# Patient Record
Sex: Female | Born: 2000 | Race: White | Hispanic: No | Marital: Single | Smoking: Former smoker
Health system: Southern US, Community
[De-identification: ages and names within clinical notes are randomized; demographics above are authoritative.]

## PROBLEM LIST (undated history)

## (undated) HISTORY — PX: NO PAST SURGERIES: SHX2092

---

## 2019-08-01 ENCOUNTER — Encounter: Payer: Self-pay | Admitting: Emergency Medicine

## 2019-08-01 ENCOUNTER — Ambulatory Visit (INDEPENDENT_AMBULATORY_CARE_PROVIDER_SITE_OTHER): Payer: Worker's Compensation

## 2019-08-01 ENCOUNTER — Ambulatory Visit
Admission: EM | Admit: 2019-08-01 | Discharge: 2019-08-01 | Disposition: A | Discharge status: Home / Self Care | Payer: Worker's Compensation | Attending: Family Medicine | Admitting: Family Medicine

## 2019-08-01 ENCOUNTER — Other Ambulatory Visit: Payer: Self-pay

## 2019-08-01 DIAGNOSIS — S97112A Crushing injury of left great toe, initial encounter: Secondary | ICD-10-CM

## 2019-08-01 DIAGNOSIS — M79675 Pain in left toe(s): Secondary | ICD-10-CM

## 2019-08-01 MED ORDER — KETOROLAC TROMETHAMINE 10 MG PO TABS: 10.0000 mg | ORAL_TABLET | Freq: Four times a day (QID) | ORAL | 0 refills | Status: DC | PRN

## 2019-08-01 NOTE — ED Triage Notes (Signed)
Pt c/o left great toe pain. She states a roll of bags fell on her toe. This occurred today at work.

## 2019-08-01 NOTE — ED Provider Notes (Signed)
[facility]    CSN: [id number] Arrival date & time: 08/01/19  1328      History   Chief Complaint Chief Complaint  Patient presents with  . Toe Injury    left Workers comp    HPI  19 year old female presents with the above complaint.  This is a [employer]. injury.  Patient reports that she was at work and dropped a roll of plastic bags on her left foot.  Patient states that the role is approximately 80 pounds.  She states that it injured her left great toe.  Patient reports pain of her great toe distally.  She reports her pain is 5/10 in severity.  Exacerbated by activity.  Also exacerbated by touch.  No relieving factors.  No other complaints.  Home Medications    Prior to Admission medications   Medication Sig Start Date End Date Taking? Authorizing Provider  ketorolac (TORADOL) 10 MG tablet Take 1 tablet (10 mg total) by mouth every 6 (six) hours as needed for moderate pain or severe pain. 08/01/19   Tommie Sams, DO  VIENVA 0.1-20 MG-MCG tablet  07/04/19   [provider]    Family History Family History  Problem Relation Age of Onset  . Healthy Mother     Social History Social History   Tobacco Use  . Smoking status: Never Smoker  . Smokeless tobacco: Never Used  Substance Use Topics  . Alcohol use: Not Currently  . Drug use: Not Currently     Allergies   Patient has no known allergies.   Review of Systems Review of Systems  Constitutional: Negative.   Musculoskeletal:       Left toe injury.   Physical Exam Triage Vital Signs ED Triage Vitals [08/01/19 1400]  Enc Vitals Group     BP (!) 92/59     Pulse Rate 62     Resp 18     Temp 98 F (36.7 C)     Temp Source Oral     SpO2 100 %     Weight 125 lb (56.7 kg)     Height 5\' 3"  (1.6 m)     Head Circumference      Peak Flow      Pain Score 5     Pain Loc      Pain Edu?      Excl. in GC?    Updated Vital Signs BP (!) 92/59 (BP Location: Left Arm)   Pulse 62   Temp  98 F (36.7 C) (Oral)   Resp 18   Ht 5\' 3"  (1.6 m)   Wt 56.7 kg   LMP 07/27/2019   SpO2 100%   BMI 22.14 kg/m   Visual Acuity Right Eye Distance:   Left Eye Distance:   Bilateral Distance:    Right Eye Near:   Left Eye Near:    Bilateral Near:     Physical Exam Vitals and nursing note reviewed.  Constitutional:      General: She is not in acute distress.    Appearance: Normal appearance. She is not ill-appearing.  HENT:     Head: Normocephalic and atraumatic.  Eyes:     General:        Right eye: No discharge.        Left eye: No discharge.     Conjunctiva/sclera: Conjunctivae normal.  Pulmonary:     Effort: Pulmonary effort is normal. No respiratory distress.  Musculoskeletal:     Comments:  Left foot -left great toe pain with tenderness palpation distally.  Patient most tender over the nail.  There is no appreciable subungual hematoma.  No bruising noted.  Neurological:     Mental Status: She is alert.  Psychiatric:        Mood and Affect: Mood normal.        Behavior: Behavior normal.    UC Treatments / Results  Labs (all labs ordered are listed, but only abnormal results are displayed) Labs Reviewed - No data to display  EKG   Radiology DG Toe Great Left  Result Date: 08/01/2019 CLINICAL DATA:  Crushing injury, left great toe pain EXAM: LEFT GREAT TOE COMPARISON:  None. FINDINGS: No definite fracture or dislocation of the left great toe. There is a tiny calcific fragment adjacent to the medial aspect of the great toe proximal phalanx at the interphalangeal joint, which may reflect a small avulsion fracture, although is of uncertain acuity. Diffuse soft tissue edema about the digit. IMPRESSION: No definite fracture or dislocation of the left great toe. There is a tiny calcific fragment adjacent to the medial aspect of the great toe proximal phalanx at the interphalangeal joint, which may reflect a small avulsion fracture, although is of uncertain acuity.  Electronically Signed   By: Eddie Candle M.D.   On: 08/01/2019 14:57    Procedures Procedures (including critical care time)  Medications Ordered in UC Medications - No data to display  Initial Impression / Assessment and Plan / UC Course  I have reviewed the triage vital signs and the nursing notes.  Pertinent labs & imaging results that were available during my care of the patient were reviewed by me and considered in my medical decision making (see chart for details).    19 year old female presents with a toe injury.  X-ray without evidence of fracture.  X-ray reviewed and interpreted independently by me.  Interpretation: There is a small calcific fragment noted.  It is quite round suggesting that it is old.  There is no evidence of acute definitive fracture or dislocation.  Patient placed in a postop shoe.  Toradol as needed.  Workmen's Comp. form filled out.  Supportive care.  Final Clinical Impressions(s) / UC Diagnoses   Final diagnoses:  Pain of left great toe  Crushing injury of left great toe, initial encounter     Discharge Instructions     Rest, ice, elevation.  Post op shoe to aid in ambulation.  Take care  Dr. Lacinda Axon     ED Prescriptions    Medication Sig Dispense Auth. Provider   ketorolac (TORADOL) 10 MG tablet Take 1 tablet (10 mg total) by mouth every 6 (six) hours as needed for moderate pain or severe pain. 20 tablet Coral Spikes, DO     PDMP not reviewed this encounter.   Coral Spikes, [place] 08/01/19 1518

## 2019-08-01 NOTE — Discharge Instructions (Addendum)
Rest, ice, elevation.  Post op shoe to aid in ambulation.  Take care  Dr. Adriana Simas

## 2020-08-24 NOTE — Patient Instructions (Signed)
I value your feedback and you entrusting us with your care. If you get a  patient survey, I would appreciate you taking the time to let us know about your experience today. Thank you! ? ? ?

## 2020-08-24 NOTE — Progress Notes (Signed)
Patient, No Pcp Per (Inactive)   Chief Complaint  Patient presents with  . Contraception    HPI:      Ms. Lynn Mcguire is a 20 y.o. No obstetric history on file. whose LMP was Patient's last menstrual period was 08/15/2020., presents today for NP [facility] consult. Menses are monthly, lasting 3-4 days, mod flow, no BTB, mild dysmen, improved with NSAIDs. Was on OCPs till a few months ago when Rx ran out, with menses Q1-2 months, 3-4 days but lighter flow and less dysmen. Would like to restart them. Has been sex active since LMP with Plan B use. No hx of HTN, DVTs, migraines with aura. Due for STD testing, no hx of STDs in past.  Pt vapes daily. Unsure if Gardasil done.   History reviewed. No pertinent past medical history.  Past Surgical History:  Procedure Laterality Date  . NO PAST SURGERIES      Family History  Problem Relation Age of Onset  . Healthy Mother     Social History   Socioeconomic History  . Marital status: Single    Spouse name: Not on file  . Number of children: Not on file  . Years of education: Not on file  . Highest education level: Not on file  Occupational History  . Not on file  Tobacco Use  . Smoking status: Current Every Day Smoker    Types: E-cigarettes    Start date: 04/02/2016  . Smokeless tobacco: Never Used  Vaping Use  . Vaping Use: Every day  Substance and Sexual Activity  . Alcohol use: Not Currently  . Drug use: Not Currently  . Sexual activity: Not on file  Other Topics Concern  . Not on file  Social History Narrative  . Not on file   Social Determinants of Health   Financial Resource Strain: Not on file  Food Insecurity: Not on file  Transportation Needs: Not on file  Physical Activity: Not on file  Stress: Not on file  Social Connections: Not on file  Intimate Partner Violence: Not on file    Outpatient Medications Prior to Visit  Medication Sig Dispense Refill  . ketorolac (TORADOL) 10 MG tablet Take 1 tablet (10 mg  total) by mouth every 6 (six) hours as needed for moderate pain or severe pain. (Patient not taking: Reported on 08/25/2020) 20 tablet 0  . VIENVA 0.1-20 MG-MCG tablet  (Patient not taking: Reported on 08/25/2020)     No facility-administered medications prior to visit.      ROS:  Review of Systems  Constitutional: Negative for fever.  Gastrointestinal: Negative for blood in stool, constipation, diarrhea, nausea and vomiting.  Genitourinary: Negative for dyspareunia, dysuria, flank pain, frequency, hematuria, urgency, vaginal bleeding, vaginal discharge and vaginal pain.  Musculoskeletal: Negative for back pain.  Skin: Negative for rash.   BREAST: No symptoms   OBJECTIVE:   Vitals:  BP 106/70   Pulse 95   Temp 99 F (37.2 C)   Resp 16   Ht 5\' 3"  (1.6 m)   Wt 121 lb (54.9 kg)   LMP 08/15/2020   SpO2 99%   BMI 21.43 kg/m   Physical Exam Vitals reviewed.  Constitutional:      Appearance: She is well-developed.  Pulmonary:     Effort: Pulmonary effort is normal.  Genitourinary:    General: Normal vulva.     Pubic Area: No rash.      Labia:        Right:  No rash, tenderness or lesion.        Left: No rash, tenderness or lesion.      Vagina: Normal. No vaginal discharge, erythema or tenderness.     Cervix: Normal.     Uterus: Normal. Not enlarged and not tender.      Adnexa: Right adnexa normal and left adnexa normal.       Right: No mass or tenderness.         Left: No mass or tenderness.    Musculoskeletal:        General: Normal range of motion.     Cervical back: Normal range of motion.  Skin:    General: Skin is warm and dry.  Neurological:     General: No focal deficit present.     Mental Status: She is alert and oriented to person, place, and time.  Psychiatric:        Mood and Affect: Mood normal.        Behavior: Behavior normal.        Thought Content: Thought content normal.        Judgment: Judgment normal.     Assessment/Plan: Encounter for  initial prescription of contraceptive pills - Plan: VIENVA 0.1-20 MG-MCG tablet; OCP start with next menses. Condoms for 1 mo. Check UPT if no menses when due since took Plan B this cycle. Rx eRxd. F/u prn.   Screening for STD (sexually transmitted disease) - Plan: Cervicovaginal ancillary only, HIV Antibody (routine testing w rflx), RPR, HSV 2 antibody, IgG, Hepatitis C antibody    Meds ordered this encounter  Medications  . VIENVA 0.1-20 MG-MCG tablet    Sig: Take 1 tablet by mouth daily.    Dispense:  84 tablet    Refill:  3    Order Specific Question:   Supervising Provider    Answer:   Nadara Mustard [[id number]]      Return in about 1 year (around 08/25/2021).  Maritza Goldsborough B. Laurelin Elson, PA-C 08/25/2020 9:46 AM

## 2020-08-25 ENCOUNTER — Ambulatory Visit (INDEPENDENT_AMBULATORY_CARE_PROVIDER_SITE_OTHER): Payer: BLUE CROSS/BLUE SHIELD | Admitting: Obstetrics and Gynecology

## 2020-08-25 ENCOUNTER — Other Ambulatory Visit: Payer: Self-pay

## 2020-08-25 ENCOUNTER — Encounter: Payer: Self-pay | Admitting: Obstetrics and Gynecology

## 2020-08-25 ENCOUNTER — Other Ambulatory Visit (HOSPITAL_COMMUNITY)
Admission: RE | Admit: 2020-08-25 | Discharge: 2020-08-25 | Disposition: A | Discharge status: Home / Self Care | Payer: Self-pay | Source: Ambulatory Visit | Attending: Obstetrics and Gynecology | Admitting: Obstetrics and Gynecology

## 2020-08-25 VITALS — BP 106/70 | HR 95 | Temp 99.0°F | Resp 16 | Ht 63.0 in | Wt 121.0 lb

## 2020-08-25 DIAGNOSIS — Z202 Contact with and (suspected) exposure to infections with a predominantly sexual mode of transmission: Secondary | ICD-10-CM

## 2020-08-25 DIAGNOSIS — Z30011 Encounter for initial prescription of contraceptive pills: Secondary | ICD-10-CM | POA: Diagnosis not present

## 2020-08-25 DIAGNOSIS — Z113 Encounter for screening for infections with a predominantly sexual mode of transmission: Secondary | ICD-10-CM | POA: Insufficient documentation

## 2020-08-25 MED ORDER — VIENVA 0.1-20 MG-MCG PO TABS: 1.0000 | ORAL_TABLET | Freq: Every day | ORAL | 3 refills | Status: DC

## 2020-08-26 LAB — CERVICOVAGINAL ANCILLARY ONLY
Chlamydia: NEGATIVE
Comment: NEGATIVE
Comment: NORMAL
Neisseria Gonorrhea: NEGATIVE

## 2020-08-26 LAB — HEPATITIS C ANTIBODY: Hep C Virus Ab: <0.1 s/co ratio (ref 0.0–0.9)

## 2020-08-26 LAB — RPR: RPR Ser Ql: NONREACTIVE

## 2020-08-26 LAB — HIV ANTIBODY (ROUTINE TESTING W REFLEX): HIV Screen 4th Generation wRfx: NONREACTIVE

## 2020-08-26 LAB — HSV 2 ANTIBODY, IGG: HSV 2 IgG, Type Spec: <0.91 index (ref 0.00–0.90)

## 2021-04-02 DIAGNOSIS — A749 Chlamydial infection, unspecified: Secondary | ICD-10-CM

## 2021-04-02 HISTORY — DX: Chlamydial infection, unspecified: A74.9

## 2021-09-01 IMAGING — CR DG TOE GREAT 2+V*L*
3 series · 3 of 3 positions shown · non-contrast
Comparison: None.

CLINICAL DATA: Crushing injury, left great toe pain

EXAM:
LEFT GREAT TOE

[toe ap]
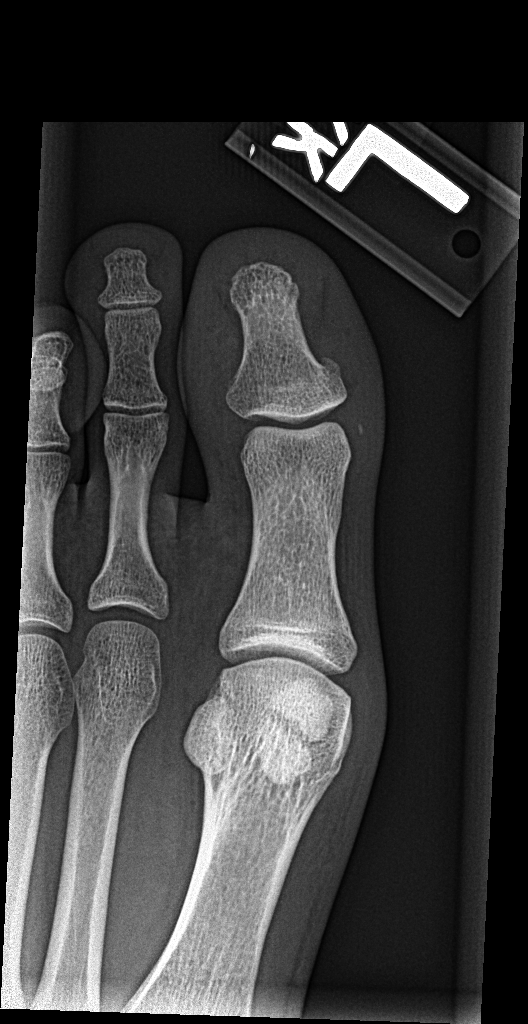

[toe obl]
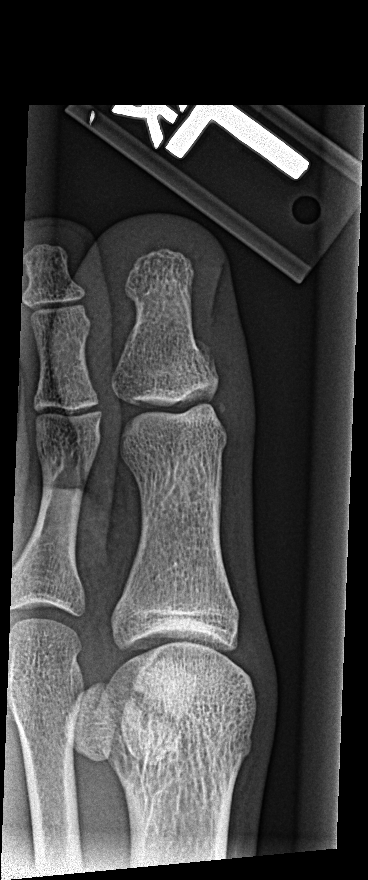

[toe lat]
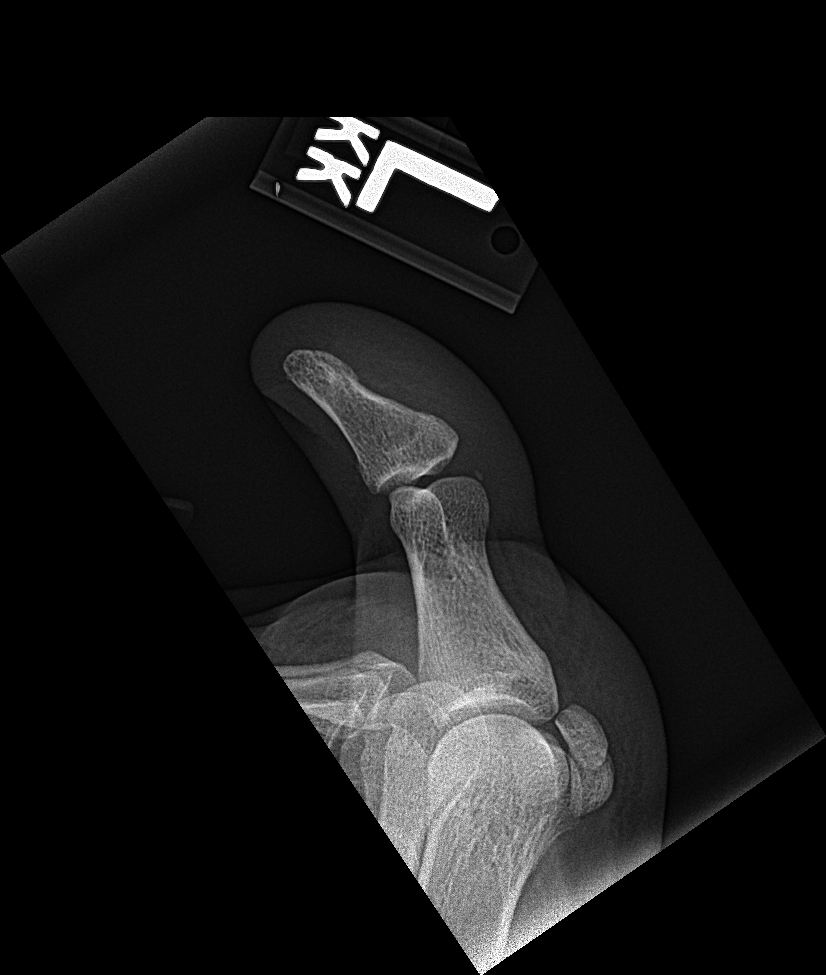

[3 of 3 positions shown; findings below may reference images not displayed]

FINDINGS: No definite fracture or dislocation of the left great toe. There is
a tiny calcific fragment adjacent to the medial aspect of the great
toe proximal phalanx at the interphalangeal joint, which may reflect
a small avulsion fracture, although is of uncertain acuity. Diffuse
soft tissue edema about the digit.
IMPRESSION: No definite fracture or dislocation of the left great toe. There is
a tiny calcific fragment adjacent to the medial aspect of the great
toe proximal phalanx at the interphalangeal joint, which may reflect
a small avulsion fracture, although is of uncertain acuity.

## 2021-10-17 ENCOUNTER — Other Ambulatory Visit (HOSPITAL_COMMUNITY)
Admission: RE | Admit: 2021-10-17 | Discharge: 2021-10-17 | Disposition: A | Discharge status: Home / Self Care | Payer: BLUE CROSS/BLUE SHIELD | Source: Ambulatory Visit | Attending: Family Medicine | Admitting: Family Medicine

## 2021-10-17 ENCOUNTER — Ambulatory Visit (INDEPENDENT_AMBULATORY_CARE_PROVIDER_SITE_OTHER): Payer: BLUE CROSS/BLUE SHIELD | Admitting: Family Medicine

## 2021-10-17 ENCOUNTER — Encounter: Payer: Self-pay | Admitting: Family Medicine

## 2021-10-17 VITALS — BP 96/58 | HR 88 | Ht 63.0 in | Wt 143.0 lb

## 2021-10-17 DIAGNOSIS — Z01419 Encounter for gynecological examination (general) (routine) without abnormal findings: Secondary | ICD-10-CM | POA: Insufficient documentation

## 2021-10-17 DIAGNOSIS — B3731 Acute candidiasis of vulva and vagina: Secondary | ICD-10-CM | POA: Insufficient documentation

## 2021-10-17 DIAGNOSIS — Z0000 Encounter for general adult medical examination without abnormal findings: Secondary | ICD-10-CM | POA: Diagnosis not present

## 2021-10-17 DIAGNOSIS — Z113 Encounter for screening for infections with a predominantly sexual mode of transmission: Secondary | ICD-10-CM | POA: Diagnosis not present

## 2021-10-17 DIAGNOSIS — A5602 Chlamydial vulvovaginitis: Secondary | ICD-10-CM | POA: Diagnosis not present

## 2021-10-17 DIAGNOSIS — Z30011 Encounter for initial prescription of contraceptive pills: Secondary | ICD-10-CM | POA: Diagnosis not present

## 2021-10-17 MED ORDER — VIENVA 0.1-20 MG-MCG PO TABS: 1.0000 | ORAL_TABLET | Freq: Every day | ORAL | 4 refills | Status: DC

## 2021-10-17 NOTE — Progress Notes (Signed)
GYNECOLOGY ANNUAL PREVENTATIVE CARE ENCOUNTER NOTE  Subjective:   Lynn Mcguire is a 21 y.o.  female here for a routine annual gynecologic exam.  Current complaints: none, needs OCP refill. Recently seen at [facility] for UTI sx. Has about 2 more days of abx left to take.  Reports no vaginal itching currently.  She is unsure about HPV vaccination. She will ask her mother.   Denies abnormal vaginal bleeding, discharge, pelvic pain, problems with intercourse or other gynecologic concerns.    Gynecologic History Patient's last menstrual period was 10/09/2021. Contraception: OCP (estrogen/progesterone) Last Pap: never had.  Last mammogram: NA.   Health Maintenance Due  Topic Date Due   HPV VACCINES (1 - 2-dose series) Never done   PAP-Cervical Cytology Screening  Never done   PAP SMEAR-Modifier  Never done   CHLAMYDIA SCREENING  08/25/2021    The following portions of the patient's history were reviewed and updated as appropriate: allergies, current medications, past family history, past medical history, past social history, past surgical history and problem list.  Review of Systems Pertinent items are noted in HPI.   Objective:  BP (!) 96/58 (Patient Position: Sitting, Cuff Size: Normal)   Pulse 88   Ht 5\' 3"  (1.6 m)   Wt 143 lb (64.9 kg)   LMP 10/09/2021   BMI 25.33 kg/m  CONSTITUTIONAL: Well-developed, well-nourished female in no acute distress.  HENT:  Normocephalic, atraumatic, External right and left ear normal. Oropharynx is clear and moist EYES:  No scleral icterus.  NECK: Normal range of motion, supple, no masses.  Normal thyroid.  SKIN: Skin is warm and dry. No rash noted. Not diaphoretic. No erythema. No pallor. NEUROLOGIC: Alert and oriented to person, place, and time. Normal reflexes, muscle tone coordination. No cranial nerve deficit noted. PSYCHIATRIC: Normal mood and affect. Normal behavior. Normal judgment and thought content. CARDIOVASCULAR: Normal heart rate noted,  regular rhythm. 2+ distal pulses. RESPIRATORY: Effort and breath sounds normal, no problems with respiration noted. BREASTS: Symmetric in size. No masses, skin changes, nipple drainage, or lymphadenopathy. ABDOMEN: Soft,  no distention noted.  No tenderness, rebound or guarding.  PELVIC: Normal appearing external genitalia-- white/thick discharge at introitus; normal appearing vaginal mucosa and cervix.  No abnormal discharge noted.  Pap smear obtained.  Normal uterine size, no other palpable masses, no uterine or adnexal tenderness. MUSCULOSKELETAL: Normal range of motion.      Assessment and Plan:  1) Annual gynecologic examination with pap smear:  Will follow up results of pap smear and manage accordingly. STI screen also ordered today- vaginal testing only.  Routine preventative health maintenance measures emphasized. Reviewed breast awareness.   2) Contraception counseling: Reviewed all forms of birth control options available including abstinence; over the counter/barrier methods; hormonal contraceptive medication including pill, patch, ring, injection,contraceptive implant; hormonal and nonhormonal IUDs; permanent sterilization options including vasectomy and the various tubal sterilization modalities. Risks and benefits reviewed.  Questions were answered.  Written information was also given to the patient to review.  Patient desires OCP, this was prescribed for patient. She will follow up in  1 year for surveillance.  She was told to call with any further questions, or with any concerns about this method of contraception.  Emphasized use of condoms 100% of the time for STI prevention.  1. Well woman exam with routine gynecological exam - Cytology - PAP  2. Screening examination for venereal disease - Cervicovaginal ancillary only  3. Encounter for initial prescription of contraceptive pills - VIENVA  0.1-20 MG-MCG tablet; Take 1 tablet by mouth daily.  Dispense: 84 tablet; Refill:  4  Please refer to After Visit Summary for other counseling recommendations.   Return in about 1 year (around 10/18/2022) for Yearly wellness exam.  Federico Flake, MD, MPH, ABFM Attending Physician Center for [facility]

## 2021-10-19 ENCOUNTER — Encounter: Payer: Self-pay | Admitting: Family Medicine

## 2021-10-19 LAB — CERVICOVAGINAL ANCILLARY ONLY
Bacterial Vaginitis (gardnerella): NEGATIVE
Candida Glabrata: NEGATIVE
Candida Vaginitis: POSITIVE — AB
Chlamydia: POSITIVE — AB
Comment: NEGATIVE
Comment: NEGATIVE
Comment: NEGATIVE
Comment: NEGATIVE
Comment: NEGATIVE
Comment: NORMAL
Neisseria Gonorrhea: NEGATIVE
Trichomonas: NEGATIVE

## 2021-10-19 LAB — CYTOLOGY - PAP: Diagnosis: NEGATIVE

## 2021-10-20 ENCOUNTER — Other Ambulatory Visit: Payer: Self-pay

## 2021-10-20 DIAGNOSIS — A749 Chlamydial infection, unspecified: Secondary | ICD-10-CM

## 2021-10-20 DIAGNOSIS — B379 Candidiasis, unspecified: Secondary | ICD-10-CM

## 2021-10-20 MED ORDER — AZITHROMYCIN 500 MG PO TABS: 1000.0000 mg | ORAL_TABLET | Freq: Once | ORAL | 0 refills | Status: DC

## 2021-10-20 MED ORDER — FLUCONAZOLE 150 MG PO TABS: 150.0000 mg | ORAL_TABLET | Freq: Once | ORAL | 0 refills | Status: AC

## 2021-10-20 MED ORDER — DOXYCYCLINE HYCLATE 100 MG PO CAPS: 100.0000 mg | ORAL_CAPSULE | Freq: Two times a day (BID) | ORAL | 0 refills | Status: DC

## 2022-11-18 NOTE — Progress Notes (Unsigned)
   PCP:  Patient, No Pcp Per   No chief complaint on file.    HPI:      Ms. Lynn Mcguire is a 22 y.o. No obstetric history on file. whose LMP was No LMP recorded., presents today for her annual examination.  Her menses are {norm/abn:715}, lasting {number: 22536} days.  Dysmenorrhea {dysmen:716}. She {does:18564} have intermenstrual bleeding.  Sex activity: {sex active: 315163}.  Last Pap: 10/17/21  Results were: no abnormalities  Hx of STDs: chlamydia; never did TOC????  There is no FH of breast cancer. There is no FH of ovarian cancer. The patient {does:18564} do self-breast exams.  Tobacco use: {tob:20664} Alcohol use: {Alcohol:11675} No drug use.  Exercise: {exercise:31265}  She {does:18564} get adequate calcium and Vitamin D in her diet.  There are no problems to display for this patient.   Past Surgical History:  Procedure Laterality Date   NO PAST SURGERIES      Family History  Problem Relation Age of Onset   Healthy Mother     Social History   Socioeconomic History   Marital status: Single    Spouse name: Not on file   Number of children: Not on file   Years of education: Not on file   Highest education level: Not on file  Occupational History   Not on file  Tobacco Use   Smoking status: Former    Types: E-cigarettes    Start date: 04/02/2016    Quit date: 08/30/2021    Years since quitting: 1.2   Smokeless tobacco: Never  Vaping Use   Vaping status: Every Day  Substance and Sexual Activity   Alcohol use: Yes    Comment: occasional   Drug use: Not Currently   Sexual activity: Not Currently  Other Topics Concern   Not on file  Social History Narrative   Not on file   Social Determinants of Health   Financial Resource Strain: Not on file  Food Insecurity: Not on file  Transportation Needs: Not on file  Physical Activity: Not on file  Stress: Not on file  Social Connections: Not on file  Intimate Partner Violence: Not on file     Current  Outpatient Medications:    doxycycline (VIBRAMYCIN) 100 MG capsule, Take 1 capsule (100 mg total) by mouth 2 (two) times daily., Disp: 14 capsule, Rfl: 0   VIENVA 0.1-20 MG-MCG tablet, Take 1 tablet by mouth daily., Disp: 84 tablet, Rfl: 4     ROS:  Review of Systems BREAST: No symptoms   Objective: There were no vitals taken for this visit.   OBGyn Exam  Results: No results found for this or any previous visit (from the past 24 hour(s)).  Assessment/Plan: No diagnosis found.  No orders of the defined types were placed in this encounter.            GYN counsel {counseling: 16159}     F/U  No follow-ups on file.  Dorrine Montone B. Doristine Shehan, PA-C 11/18/2022 3:50 PM

## 2022-11-19 ENCOUNTER — Other Ambulatory Visit (HOSPITAL_COMMUNITY)
Admission: RE | Admit: 2022-11-19 | Discharge: 2022-11-19 | Disposition: A | Discharge status: Home / Self Care | Payer: Managed Care, Other (non HMO) | Source: Ambulatory Visit | Attending: Obstetrics and Gynecology | Admitting: Obstetrics and Gynecology

## 2022-11-19 ENCOUNTER — Encounter: Payer: Self-pay | Admitting: Obstetrics and Gynecology

## 2022-11-19 ENCOUNTER — Ambulatory Visit (INDEPENDENT_AMBULATORY_CARE_PROVIDER_SITE_OTHER): Payer: Managed Care, Other (non HMO) | Admitting: Obstetrics and Gynecology

## 2022-11-19 VITALS — BP 112/69 | HR 72 | Resp 16 | Ht 64.0 in | Wt 145.1 lb

## 2022-11-19 DIAGNOSIS — A749 Chlamydial infection, unspecified: Secondary | ICD-10-CM

## 2022-11-19 DIAGNOSIS — Z113 Encounter for screening for infections with a predominantly sexual mode of transmission: Secondary | ICD-10-CM | POA: Diagnosis present

## 2022-11-19 DIAGNOSIS — Z01419 Encounter for gynecological examination (general) (routine) without abnormal findings: Secondary | ICD-10-CM | POA: Diagnosis not present

## 2022-11-19 DIAGNOSIS — Z30011 Encounter for initial prescription of contraceptive pills: Secondary | ICD-10-CM

## 2022-11-19 DIAGNOSIS — Z3041 Encounter for surveillance of contraceptive pills: Secondary | ICD-10-CM

## 2022-11-19 MED ORDER — LEVONORGESTREL-ETHINYL ESTRAD 0.1-20 MG-MCG PO TABS: 1.0000 | ORAL_TABLET | Freq: Every day | ORAL | 3 refills | Status: DC

## 2022-11-19 NOTE — Patient Instructions (Signed)
I value your feedback and you entrusting us with your care. If you get a [facility] patient survey, I would appreciate you taking the time to let us know about your experience today. Thank you! ? ? ?

## 2022-11-20 LAB — CERVICOVAGINAL ANCILLARY ONLY
Chlamydia: POSITIVE — AB
Comment: NEGATIVE
Comment: NORMAL
Neisseria Gonorrhea: NEGATIVE

## 2022-11-20 MED ORDER — AZITHROMYCIN 500 MG PO TABS: 1000.0000 mg | ORAL_TABLET | Freq: Once | ORAL | 0 refills | Status: AC

## 2022-11-20 NOTE — Progress Notes (Signed)
Psl notify [facility]. Thx.

## 2022-11-20 NOTE — Addendum Note (Signed)
Addended by: Althea Grimmer B on: 11/20/2022 01:49 PM   Modules accepted: Orders

## 2023-01-29 ENCOUNTER — Ambulatory Visit (INDEPENDENT_AMBULATORY_CARE_PROVIDER_SITE_OTHER): Payer: Managed Care, Other (non HMO)

## 2023-01-29 ENCOUNTER — Other Ambulatory Visit (HOSPITAL_COMMUNITY)
Admission: RE | Admit: 2023-01-29 | Discharge: 2023-01-29 | Disposition: A | Discharge status: Home / Self Care | Payer: Managed Care, Other (non HMO) | Source: Ambulatory Visit | Attending: Obstetrics and Gynecology | Admitting: Obstetrics and Gynecology

## 2023-01-29 VITALS — BP 103/65 | HR 73 | Ht 63.0 in | Wt 147.0 lb

## 2023-01-29 DIAGNOSIS — Z113 Encounter for screening for infections with a predominantly sexual mode of transmission: Secondary | ICD-10-CM

## 2023-01-29 NOTE — Patient Instructions (Signed)
Trichomoniasis Trichomoniasis is a sexually transmitted infection (STI). Many people with trichomoniasis do not have any symptoms (are asymptomatic) or have only minimal symptoms. Untreated trichomoniasis can last from months to years. This condition is treated with medicine. What are the causes? This condition is caused by a parasite called Trichomonas vaginalis and is transmitted during sexual contact. What increases the risk? The following factors may make you more likely to develop this condition: Having unprotected sex. Having sex with a partner who has trichomoniasis. Having multiple sexual partners. Having had previous trichomoniasis infections or other STIs. What are the signs or symptoms? In females, symptoms of trichomoniasis include: Itching, burning, redness, or soreness in the genital area. Discomfort while urinating. Abnormal vaginal discharge that is clear, white, gray, or yellow-green and foamy and has an unusual fishy odor. In males, symptoms of trichomoniasis include: Discharge from the penis. Burning after urination or ejaculation. Itching or discomfort inside the penis. How is this diagnosed? This condition is diagnosed based on tests. To perform a test, your health care provider will do one of the following: Ask you to provide a urine sample. Take a sample of discharge. The sample may be taken from the vagina or cervix in females and from the urethra in males. Your health care provider may use a swab to collect the sample. Your health care provider may test you for other STIs, including human immunodeficiency virus (HIV). How is this treated?  This condition is treated with medicines such as metronidazole or tinidazole. These are called antimicrobial medicines, and they are taken by mouth (orally). Your sexual partner or partners also need to be tested and treated. If they have the infection and are not treated, you will likely get reinfected. If you plan to become  pregnant or think you may be pregnant, tell your health care provider right away. Some medicines that are used to treat the infection should not be taken during pregnancy. Your health care provider may recommend over-the-counter medicines or creams to help relieve itching or irritation. You may be tested for the infection again 3 months after treatment. Follow these instructions at home: Medicines Take over-the-counter and prescription medicines only as told by your health care provider. Take your antimicrobial medicine as told by your health care provider. Do not stop taking it even if you start to feel better. Use creams as told by your health care provider. General instructions Do not have sex until after you finish your medicine and your symptoms have resolved. Do not wear tampons while you have the infection (if you are female). Talk with your sexual partner or partners about any symptoms that either of you may have, as well as any history of STIs. Keep all follow-up visits. This is important. How is this prevented?  Use condoms every time you have sex. Using condoms correctly and consistently can help protect against STIs. Do not have sexual contact if you have symptoms of trichomoniasis or another STI. Avoid having multiple sexual partners. Get tested for STIs before you have sex with a partner. Ask all partners to do the same. Do not douche (if you are female). Douching may increase your risk for getting STIs due to the removal of good bacteria in the vagina. Contact a health care provider if: You still have symptoms after you finish your medicine. You develop a rash. You plan to become pregnant or think you may be pregnant. Summary Trichomoniasis is a sexually transmitted infection (STI). This condition often has no symptoms or minimal  symptoms. Take your antimicrobial medicine as told by your health care provider. Do not stop taking even if you start to feel better. Discuss your  infection with your sexual partner or partners. Make sure that all partners get tested and treated, if necessary. You should not have sex until after you finish your medicine and your symptoms have resolved. Keep all follow-up visits. This is important. This information is not intended to replace advice given to you by your health care provider. Make sure you discuss any questions you have with your health care provider. Document Revised: 02/15/2021 Document Reviewed: 02/15/2021 Elsevier Patient Education  2024 [employer] Gonorrhea Gonorrhea is a sexually transmitted infection (STI) that can infect any person. If left untreated, this infection can: Damage the reproductive organs. Spread to other parts of the body. Cause someone to be unable to have children (infertility). Harm an unborn baby if an infected person is pregnant. It is important to get treatment for gonorrhea as soon as possible. All of your sex partners may also need to be treated for the infection. What are the causes? This condition is caused by bacteria called Neisseria gonorrhoeae. The infection is spread from person to person through sexual contact, including oral, anal, and vaginal sex. The infection can also pass from a pregnant person to the baby during birth. What increases the risk? The following factors may make you more likely to develop this condition: Being a woman younger than 25 years and sexually active. Being a man who has sex with men. Having a new sex partner or having multiple partners. Having a sex partner who has an STI. Not using condoms correctly or not using condoms every time you have sex. Having a history of STIs. What are the signs or symptoms? When symptoms occur, they may include: Abnormal discharge from the penis or vagina. The discharge may be cloudy, thick, or yellow-green in color. Pain or burning when you urinate. Itching, irritation, pain, bleeding, or discharge from the rectum. This  may occur if the infection was spread by anal sex. Sore throat or swollen lymph nodes in the neck. This may occur if the infection was spread by oral sex. Pain or swelling in the testicles. Bleeding between menstrual periods. If the infection has spread to other areas of the body, symptoms may include: Fever. Eye irritation. Swelling, redness, warmth, and pain in the joints. Rashes. In some cases, there are no symptoms. How is this diagnosed? This condition is diagnosed based on: A physical exam. A swab of fluid. The swab of fluid may be taken from the penis, vagina, throat, or rectum. Urine tests. Not all test results will be available during your visit. How is this treated? This condition is treated with antibiotic medicines. It is important to start treatment as soon as possible. Early treatment may prevent some problems from developing. You should not have sex during treatment. All types of sexual activity should be avoided for at least 7 days after treatment is complete and until your sex partner or partners have been treated. Follow these instructions at home: Take over-the-counter and prescription medicines as told by your health care provider. Finish all antibiotic medicine even when you start to feel better. Do not have sex during treatment. Do not have sex until at least 7 days after you and your partner or partners have finished treatment and your health care provider says it is okay. It is up to you to get your test results. Ask your health care provider, or  the department that is doing the test, when your results will be ready. If you get a positive result on your gonorrhea test, tell your recent sex partners. These include any partners for oral, anal, or vaginal sex. They need to be checked for gonorrhea even if they do not have symptoms. They may need treatment, even if they get negative results on their gonorrhea tests. Keep all follow-up visits. This is important. How is  this prevented?  Use latex or polyurethane condoms correctly every time you have sex. Ask if your sex partner or partners have been tested for STIs and had negative results. Avoid having multiple sex partners. Get regular health screenings to check for STIs. Contact a health care provider if: Your symptoms do not get better after a few days of taking antibiotics. Your symptoms get worse. You cannot take your medicine as directed by your health care provider. You develop new symptoms, including: Eye irritation. Swelling, redness, warmth, and pain in the joints. Rashes. You have a fever. Summary Gonorrhea is a sexually transmitted infection (STI) that can infect any person. This infection is spread from person to person through sexual contact, including oral, anal, and vaginal sex. The infection can also pass from a pregnant person to the baby during birth. Symptoms include abnormal discharge, pain or burning while urinating, or pain in the rectum. This condition is treated with antibiotic medicines. Do not have sex until at least 7 days after both you and any sex partners have completed antibiotic treatment. Tell your health care provider if you have trouble taking your medicine, your symptoms get worse, or you have new symptoms. Keep all follow-up visits. This information is not intended to replace advice given to you by your health care provider. Make sure you discuss any questions you have with your health care provider. Document Revised: 02/09/2021 Document Reviewed: 02/09/2021 Elsevier Patient Education  2024 [employer] Chlamydia, Female  Chlamydia is a sexually transmitted infection (STI). This infection spreads through sexual contact. Chlamydia can occur in different areas of the body, including: The urethra. This is the part of the body that drains urine from the bladder. The cervix. This is the lowest part of the uterus. The throat. The rectum. This condition is not  difficult to treat. However, if left untreated, chlamydia can lead to more serious health problems, including pelvic inflammatory disease (PID). PID can increase your risk of being unable to have children. In pregnant women, untreated chlamydia can cause serious complications during pregnancy or health problems for the baby after delivery. What are the causes? This condition is caused by a bacteria called Chlamydia trachomatis. The bacteria are spread from an infected partner during sexual activity. Chlamydia can spread through contact with the genitals, mouth, or rectum. What increases the risk? The following factors may make you more likely to develop this condition: Not using a condom the right way or not using a condom every time you have sex. Having a new sex partner or having more than one sex partner. Being sexually active before age 37. What are the signs or symptoms? In some cases, there are no symptoms, especially early in the infection. If symptoms develop, they may include: Urinating often, or a burning feeling during urination. Redness, soreness, or swelling of the vagina or rectum. Discharge coming from the vagina or rectum. Pain in the abdomen. Pain during sex. Bleeding between menstrual periods or irregular periods. How is this diagnosed? This condition may be diagnosed with: Urine tests. Swab tests. Depending  on your symptoms, your health care provider may use a cotton swab to collect a fluid sample from your vagina, rectum, nose, or throat to test for the bacteria. A pelvic exam. How is this treated? This condition is treated with antibiotic medicines. Follow these instructions at home: Sexual activity Tell your sex partner or partners about your infection. These include any partners for oral, anal, or vaginal sex that you have had within 60 days of when your symptoms started. Sex partners should also be treated, even if they have no signs of the infection. Do not have  sex until you and your sex partners have completed treatment and your health care provider says it is okay. If your health care provider prescribed you a single-dose medicine as treatment, wait at least 7 days after taking the medicine before having sex. General instructions Take over-the-counter and prescription medicines as told by your health care provider. Finish all antibiotic medicine even when you start to feel better. It is up to you to get your test results. Ask your health care provider, or the department that is doing the test, when your results will be ready. Keep all follow-up visits. This is important. You may need to be tested for infection again 3 months after treatment. How is this prevented? You can lower your risk of getting chlamydia by: Using latex or polyurethane condoms correctly every time you have sex. Not having multiple sex partners. Asking if your sex partner has been tested for STIs and had negative results. Getting regular health screenings to check for STIs. Contact a health care provider if: You develop new symptoms or your symptoms are getting worse. Your symptoms do not get better after treatment. You have a fever or chills. You have pain during sex. You have irregular menstrual periods, or you have bleeding between periods or after sex. You develop flu-like symptoms, such as night sweats, sore throat, or muscle aches. You are unable to take your antibiotic medicine as prescribed. Summary Chlamydia is a sexually transmitted infection (STI) that is caused by bacteria. This infection spreads through sexual contact. This condition is treated with antibiotic medicines. If left untreated, chlamydia can lead to more serious health problems, including pelvic inflammatory disease (PID). Your sex partners will also need to be treated. Do not have sex until both you and your partner have been treated. Take medicines as directed by your health care provider and keep all  follow-up visits to ensure your infection has been completely treated. This information is not intended to replace advice given to you by your health care provider. Make sure you discuss any questions you have with your health care provider. Document Revised: 01/09/2021 Document Reviewed: 01/09/2021 Elsevier Patient Education  2024 [employer].

## 2023-01-29 NOTE — Progress Notes (Signed)
    NURSE VISIT NOTE  Subjective:    Patient ID: Lynn Mcguire, female    DOB: Nov 18, 2000, 22 y.o.   MRN: [id number]  HPI  Patient is a 22 y.o. G0P0000 female who presents for a self swab nurse visit for test of cure. Denies abnormal vaginal bleeding or significant pelvic pain or fever. Denies UTI symptoms. Patient has history of known exposure to STD.   Objective:    Ht 5\' 3"  (1.6 m)   Wt 147 lb (66.7 kg)   LMP 01/21/2023 (Approximate)   BMI 26.04 kg/m     Assessment:   1. Screening for STD (sexually transmitted disease)      Plan:   Aptima sent to lab. Treatment: Will wait for results to be treated if needed. ROV prn if symptoms persist or worsen.   Donnetta Hail, CMA

## 2023-01-31 LAB — CERVICOVAGINAL ANCILLARY ONLY
Chlamydia: POSITIVE — AB
Comment: NEGATIVE
Comment: NEGATIVE
Comment: NORMAL
Neisseria Gonorrhea: NEGATIVE
Trichomonas: NEGATIVE

## 2023-02-01 ENCOUNTER — Other Ambulatory Visit: Payer: Self-pay

## 2023-02-01 DIAGNOSIS — Z113 Encounter for screening for infections with a predominantly sexual mode of transmission: Secondary | ICD-10-CM

## 2023-02-01 DIAGNOSIS — A749 Chlamydial infection, unspecified: Secondary | ICD-10-CM

## 2023-02-01 MED ORDER — AZITHROMYCIN 500 MG PO TABS: 1000.0000 mg | ORAL_TABLET | Freq: Once | ORAL | 0 refills | Status: AC

## 2023-02-01 NOTE — Progress Notes (Signed)
Azithromycin 1g sent to pharmacy. Patient made aware.

## 2023-03-13 ENCOUNTER — Ambulatory Visit (INDEPENDENT_AMBULATORY_CARE_PROVIDER_SITE_OTHER): Payer: Managed Care, Other (non HMO)

## 2023-03-13 ENCOUNTER — Other Ambulatory Visit (HOSPITAL_COMMUNITY)
Admission: RE | Admit: 2023-03-13 | Discharge: 2023-03-13 | Disposition: A | Discharge status: Home / Self Care | Payer: Managed Care, Other (non HMO) | Source: Ambulatory Visit | Attending: Obstetrics and Gynecology | Admitting: Obstetrics and Gynecology

## 2023-03-13 VITALS — BP 127/72 | HR 86 | Ht 63.0 in | Wt 145.0 lb

## 2023-03-13 DIAGNOSIS — B3731 Acute candidiasis of vulva and vagina: Secondary | ICD-10-CM | POA: Insufficient documentation

## 2023-03-13 DIAGNOSIS — Z113 Encounter for screening for infections with a predominantly sexual mode of transmission: Secondary | ICD-10-CM | POA: Insufficient documentation

## 2023-03-13 NOTE — Progress Notes (Signed)
    NURSE VISIT NOTE  Subjective:    Patient ID: Lynn Mcguire, female    DOB: 2000-10-19, 22 y.o.   MRN: [id number]  HPI  Patient is a 22 y.o. G0P0000 female who presents for STD screen stated no symptoms she has had a partner change recently. Patient admits to history of known exposure to STD.Patient verified her label for ancillary swab.  Objective:    BP 127/72   Pulse 86   Ht 5\' 3"  (1.6 m)   Wt 145 lb (65.8 kg)   BMI 25.69 kg/m     Assessment:   1. Screening for STDs (sexually transmitted diseases)       Plan:   GC and chlamydia DNA  probe sent to lab. Treatment: abstain from coitus during course of treatment ROV prn if symptoms persist or worsen.   Loney Laurence, CMA

## 2023-03-15 ENCOUNTER — Other Ambulatory Visit: Payer: Self-pay

## 2023-03-15 DIAGNOSIS — B379 Candidiasis, unspecified: Secondary | ICD-10-CM

## 2023-03-15 LAB — CERVICOVAGINAL ANCILLARY ONLY
Bacterial Vaginitis (gardnerella): NEGATIVE
Candida Glabrata: NEGATIVE
Candida Vaginitis: POSITIVE — AB
Chlamydia: NEGATIVE
Comment: NEGATIVE
Comment: NEGATIVE
Comment: NEGATIVE
Comment: NEGATIVE
Comment: NEGATIVE
Comment: NORMAL
Neisseria Gonorrhea: NEGATIVE
Trichomonas: NEGATIVE

## 2023-03-15 MED ORDER — FLUCONAZOLE 150 MG PO TABS: 150.0000 mg | ORAL_TABLET | Freq: Every day | ORAL | 0 refills | Status: DC

## 2023-10-25 ENCOUNTER — Other Ambulatory Visit: Payer: Self-pay

## 2023-10-25 DIAGNOSIS — Z30011 Encounter for initial prescription of contraceptive pills: Secondary | ICD-10-CM

## 2023-10-25 MED ORDER — LEVONORGESTREL-ETHINYL ESTRAD 0.1-20 MG-MCG PO TABS: 1.0000 | ORAL_TABLET | Freq: Every day | ORAL | 0 refills | Status: DC

## 2023-11-25 ENCOUNTER — Ambulatory Visit: Admitting: Obstetrics and Gynecology

## 2023-11-26 NOTE — Progress Notes (Deleted)
 PCP:  Patient, No Pcp Per   No chief complaint on file.    HPI:      Ms. Lynn Mcguire is a 23 y.o. G0P0000 whose LMP was No LMP recorded., presents today for her annual examination.  Her menses are regular every 28-30 days, lasting 3-4 days.  Dysmenorrhea mild, improved with NSAIDs; was better with OCPs, would like to restart.   Sex activity: not sexually active.  Last Pap: 10/17/21  Results were: no abnormalities  Hx of STDs: chlamydia 2023, treated with doxy, never did TOC  There is no FH of breast cancer. There is no FH of ovarian cancer. The patient does do self-breast exams.  Tobacco use: vapes daily Alcohol use: social drinker No drug use.  Exercise: not active  She does get adequate calcium but not Vitamin D in her diet. Unsure if Lynn Mcguire done  Past Medical History:  Diagnosis Date   Chlamydia 2023    Past Surgical History:  Procedure Laterality Date   NO PAST SURGERIES      Family History  Problem Relation Age of Onset   Healthy Mother     Social History   Socioeconomic History   Marital status: Single    Spouse name: Not on file   Number of children: Not on file   Years of education: Not on file   Highest education level: Not on file  Occupational History   Not on file  Tobacco Use   Smoking status: Former    Types: E-cigarettes    Start date: 04/02/2016    Quit date: 08/30/2021    Years since quitting: 2.2   Smokeless tobacco: Never  Vaping Use   Vaping status: Some Days  Substance and Sexual Activity   Alcohol use: Yes    Comment: occasional   Drug use: Not Currently   Sexual activity: Not Currently    Birth control/protection: Pill  Other Topics Concern   Not on file  Social History Narrative   Not on file   Social Drivers of Health   Financial Resource Strain: Not on file  Food Insecurity: Not on file  Transportation Needs: Not on file  Physical Activity: Not on file  Stress: Not on file  Social Connections: Not on file  Intimate  Partner Violence: Not on file     Current Outpatient Medications:    fluconazole  (DIFLUCAN ) 150 MG tablet, Take 1 tablet (150 mg total) by mouth daily., Disp: 1 tablet, Rfl: 0   levonorgestrel -ethinyl estradiol (VIENVA ) 0.1-20 MG-MCG tablet, Take 1 tablet by mouth daily., Disp: 30 tablet, Rfl: 0     ROS:  Review of Systems  Constitutional:  Negative for fatigue, fever and unexpected weight change.  Respiratory:  Negative for cough, shortness of breath and wheezing.   Cardiovascular:  Negative for chest pain, palpitations and leg swelling.  Gastrointestinal:  Negative for blood in stool, constipation, diarrhea, nausea and vomiting.  Endocrine: Negative for cold intolerance, heat intolerance and polyuria.  Genitourinary:  Negative for dyspareunia, dysuria, flank pain, frequency, genital sores, hematuria, menstrual problem, pelvic pain, urgency, vaginal bleeding, vaginal discharge and vaginal pain.  Musculoskeletal:  Negative for back pain, joint swelling and myalgias.  Skin:  Negative for rash.  Neurological:  Negative for dizziness, syncope, light-headedness, numbness and headaches.  Hematological:  Negative for adenopathy.  Psychiatric/Behavioral:  Negative for agitation, confusion, sleep disturbance and suicidal ideas. The patient is not nervous/anxious.    BREAST: No symptoms   Objective: There were no vitals taken  for this visit.   Physical Exam Constitutional:      Appearance: She is well-developed.  Genitourinary:     Vulva normal.     Right Labia: No rash, tenderness or lesions.    Left Labia: No tenderness, lesions or rash.    No vaginal discharge, erythema or tenderness.      Right Adnexa: not tender and no mass present.    Left Adnexa: not tender and no mass present.    No cervical friability or polyp.     Uterus is not enlarged or tender.  Breasts:    Right: No mass, nipple discharge, skin change or tenderness.     Left: No mass, nipple discharge, skin change  or tenderness.  Neck:     Thyroid: No thyromegaly.  Cardiovascular:     Rate and Rhythm: Normal rate and regular rhythm.     Heart sounds: Normal heart sounds. No murmur heard. Pulmonary:     Effort: Pulmonary effort is normal.     Breath sounds: Normal breath sounds.  Abdominal:     Palpations: Abdomen is soft.     Tenderness: There is no abdominal tenderness. There is no guarding or rebound.  Musculoskeletal:        General: Normal range of motion.     Cervical back: Normal range of motion.  Lymphadenopathy:     Cervical: No cervical adenopathy.  Neurological:     General: No focal deficit present.     Mental Status: She is alert and oriented to person, place, and time.     Cranial Nerves: No cranial nerve deficit.  Skin:    General: Skin is warm and dry.  Psychiatric:        Mood and Affect: Mood normal.        Behavior: Behavior normal.        Thought Content: Thought content normal.        Judgment: Judgment normal.  Vitals reviewed.     Assessment/Plan: Encounter for annual routine gynecological examination  Screening for STD (sexually transmitted disease) - Plan: Cervicovaginal ancillary only  Chlamydia - Plan: Cervicovaginal ancillary only; TOC today  Encounter for initial prescription of contraceptive pills - Plan: levonorgestrel -ethinyl estradiol (VIENVA ) 0.1-20 MG-MCG tablet; OCP RF eRxd. Restart with menses, condoms.   No orders of the defined types were placed in this encounter.            GYN counsel adequate intake of calcium and vitamin D, diet and exercise; vape cessation/ Gardasil discussed     F/U  No follow-ups on file.  Syliva Mee B. Kjersten Ormiston, PA-C 11/26/2023 1:37 PM

## 2023-11-27 ENCOUNTER — Ambulatory Visit: Admitting: Obstetrics and Gynecology

## 2023-11-27 DIAGNOSIS — Z01419 Encounter for gynecological examination (general) (routine) without abnormal findings: Secondary | ICD-10-CM

## 2023-11-27 DIAGNOSIS — Z113 Encounter for screening for infections with a predominantly sexual mode of transmission: Secondary | ICD-10-CM

## 2023-11-27 DIAGNOSIS — Z3041 Encounter for surveillance of contraceptive pills: Secondary | ICD-10-CM

## 2023-11-27 DIAGNOSIS — Z124 Encounter for screening for malignant neoplasm of cervix: Secondary | ICD-10-CM

## 2024-02-07 NOTE — Progress Notes (Signed)
 PCP:  Patient, No Pcp Per   Chief Complaint  Patient presents with   Gynecologic Exam    Interested in restarting BC pills again, stopped 6 months ago.     HPI:      Ms. Lynn Mcguire is a 23 y.o. G0P0000 whose LMP was Patient's last menstrual period was 01/26/2024 (approximate)., presents today for her annual examination.  Her menses are regular every 28-30 days, lasting 5-7 days, mod flow, no BTB.  Dysmenorrhea mild to mod, improved with NSAIDs; was better with OCPs, would like to restart.   Sex activity: currently sexually active; contraception--none. Wants to restart OCPs. No hx of HTN, DVTs, migraines with aura.  Last Pap: 10/17/21  Results were: no abnormalities  Hx of STDs: chlamydia 2023 and 2024 with neg TOC  There is no FH of breast cancer. There is no FH of ovarian cancer. The patient does do self-breast exams.  Tobacco use: vapes daily Alcohol use: social drinker No drug use.  Exercise: not active  She does get adequate calcium but not Vitamin D in her diet. Unsure if [place] done  Past Medical History:  Diagnosis Date   Chlamydia 2023    Past Surgical History:  Procedure Laterality Date   NO PAST SURGERIES      Family History  Problem Relation Age of Onset   Healthy Mother     Social History   Socioeconomic History   Marital status: Single    Spouse name: Not on file   Number of children: Not on file   Years of education: Not on file   Highest education level: Not on file  Occupational History   Not on file  Tobacco Use   Smoking status: Former    Types: Cigarettes   Smokeless tobacco: Never  Vaping Use   Vaping status: Every Day  Substance and Sexual Activity   Alcohol use: Yes    Comment: occasional   Drug use: Not Currently   Sexual activity: Yes    Birth control/protection: None  Other Topics Concern   Not on file  Social History Narrative   Not on file   Social Drivers of Health   Financial Resource Strain: Not on file  Food  Insecurity: Not on file  Transportation Needs: Not on file  Physical Activity: Not on file  Stress: Not on file  Social Connections: Not on file  Intimate Partner Violence: Not on file     Current Outpatient Medications:    levonorgestrel -ethinyl estradiol (VIENVA ) 0.1-20 MG-MCG tablet, Take 1 tablet by mouth daily., Disp: 84 tablet, Rfl: 3     ROS:  Review of Systems  Constitutional:  Negative for fatigue, fever and unexpected weight change.  Respiratory:  Negative for cough, shortness of breath and wheezing.   Cardiovascular:  Negative for chest pain, palpitations and leg swelling.  Gastrointestinal:  Negative for blood in stool, constipation, diarrhea, nausea and vomiting.  Endocrine: Negative for cold intolerance, heat intolerance and polyuria.  Genitourinary:  Negative for dyspareunia, dysuria, flank pain, frequency, genital sores, hematuria, menstrual problem, pelvic pain, urgency, vaginal bleeding, vaginal discharge and vaginal pain.  Musculoskeletal:  Negative for back pain, joint swelling and myalgias.  Skin:  Negative for rash.  Neurological:  Negative for dizziness, syncope, light-headedness, numbness and headaches.  Hematological:  Negative for adenopathy.  Psychiatric/Behavioral:  Negative for agitation, confusion, sleep disturbance and suicidal ideas. The patient is not nervous/anxious.    BREAST: No symptoms   Objective: BP 95/62   Pulse  73   Ht 5' 3 (1.6 m)   Wt 153 lb (69.4 kg)   LMP 01/26/2024 (Approximate)   BMI 27.10 kg/m    Physical Exam Constitutional:      Appearance: She is well-developed.  Genitourinary:     Vulva normal.     Right Labia: No rash, tenderness or lesions.    Left Labia: No tenderness, lesions or rash.    No vaginal discharge, erythema or tenderness.      Right Adnexa: not tender and no mass present.    Left Adnexa: not tender and no mass present.    No cervical friability or polyp.     Uterus is not enlarged or tender.   Breasts:    Right: No mass, nipple discharge, skin change or tenderness.     Left: No mass, nipple discharge, skin change or tenderness.  Neck:     Thyroid: No thyromegaly.  Cardiovascular:     Rate and Rhythm: Normal rate and regular rhythm.     Heart sounds: Normal heart sounds. No murmur heard. Pulmonary:     Effort: Pulmonary effort is normal.     Breath sounds: Normal breath sounds.  Abdominal:     Palpations: Abdomen is soft.     Tenderness: There is no abdominal tenderness. There is no guarding or rebound.  Musculoskeletal:        General: Normal range of motion.     Cervical back: Normal range of motion.  Lymphadenopathy:     Cervical: No cervical adenopathy.  Neurological:     General: No focal deficit present.     Mental Status: She is alert and oriented to person, place, and time.     Cranial Nerves: No cranial nerve deficit.  Skin:    General: Skin is warm and dry.  Psychiatric:        Mood and Affect: Mood normal.        Behavior: Behavior normal.        Thought Content: Thought content normal.        Judgment: Judgment normal.  Vitals reviewed.     Assessment/Plan: Encounter for annual routine gynecological examination  Cervical cancer screening - Plan: Cytology - PAP  Screening for STD (sexually transmitted disease) - Plan: Cytology - PAP  Encounter for initial prescription of contraceptive pills - Plan: levonorgestrel -ethinyl estradiol (VIENVA ) 0.1-20 MG-MCG tablet; Rx OCPs eRxd. Start with menses, condoms for 1 wk and until OCP start. F/u prn.    Meds ordered this encounter  Medications   levonorgestrel -ethinyl estradiol (VIENVA ) 0.1-20 MG-MCG tablet    Sig: Take 1 tablet by mouth daily.    Dispense:  84 tablet    Refill:  3    Supervising Provider:   ROBY, MICIA [[id number]]             GYN counsel adequate intake of calcium and vitamin D, diet and exercise; vape cessation/ Gardasil discussed     F/U  Return in about 1 year (around  02/09/2025).  Edison Nicholson B. Albin Duckett, PA-C 02/10/2024 9:15 AM

## 2024-02-10 ENCOUNTER — Encounter: Payer: Self-pay | Admitting: Obstetrics and Gynecology

## 2024-02-10 ENCOUNTER — Ambulatory Visit: Admitting: Obstetrics and Gynecology

## 2024-02-10 ENCOUNTER — Other Ambulatory Visit (HOSPITAL_COMMUNITY)
Admission: RE | Admit: 2024-02-10 | Discharge: 2024-02-10 | Disposition: A | Discharge status: Home / Self Care | Source: Ambulatory Visit | Attending: Obstetrics and Gynecology | Admitting: Obstetrics and Gynecology

## 2024-02-10 VITALS — BP 95/62 | HR 73 | Ht 63.0 in | Wt 153.0 lb

## 2024-02-10 DIAGNOSIS — Z01419 Encounter for gynecological examination (general) (routine) without abnormal findings: Secondary | ICD-10-CM | POA: Diagnosis not present

## 2024-02-10 DIAGNOSIS — Z113 Encounter for screening for infections with a predominantly sexual mode of transmission: Secondary | ICD-10-CM | POA: Diagnosis present

## 2024-02-10 DIAGNOSIS — Z30011 Encounter for initial prescription of contraceptive pills: Secondary | ICD-10-CM

## 2024-02-10 DIAGNOSIS — Z124 Encounter for screening for malignant neoplasm of cervix: Secondary | ICD-10-CM | POA: Diagnosis present

## 2024-02-10 MED ORDER — LEVONORGESTREL-ETHINYL ESTRAD 0.1-20 MG-MCG PO TABS: 1.0000 | ORAL_TABLET | Freq: Every day | ORAL | 3 refills | Status: AC

## 2024-02-10 NOTE — Patient Instructions (Signed)
 I value your feedback and you entrusting [place] with your care. If you get a [place] patient survey, I would appreciate you taking the time to let us know about your experience today. Thank you! ? ? ?

## 2024-02-11 LAB — CYTOLOGY - PAP
Chlamydia: NEGATIVE
Comment: NEGATIVE
Comment: NORMAL
Diagnosis: NEGATIVE
Neisseria Gonorrhea: NEGATIVE
